# Patient Record
Sex: Female | Born: 1955 | Race: White | Hispanic: No | Marital: Married | Smoking: Never smoker
Health system: Southern US, Community
[De-identification: ages and names within clinical notes are randomized; demographics above are authoritative.]

## PROBLEM LIST (undated history)

## (undated) DIAGNOSIS — R011 Cardiac murmur, unspecified: Secondary | ICD-10-CM

## (undated) DIAGNOSIS — I341 Nonrheumatic mitral (valve) prolapse: Secondary | ICD-10-CM

## (undated) HISTORY — PX: OTHER SURGICAL HISTORY: SHX169

## (undated) HISTORY — PX: ECTOPIC PREGNANCY SURGERY: SHX613

## (undated) HISTORY — PX: TONSILLECTOMY: SUR1361

## (undated) HISTORY — PX: BREAST EXCISIONAL BIOPSY: SUR124

## (undated) HISTORY — PX: BREAST CYST ASPIRATION: SHX578

---

## 1999-08-20 ENCOUNTER — Encounter: Payer: Self-pay | Admitting: Emergency Medicine

## 1999-08-20 ENCOUNTER — Emergency Department (HOSPITAL_COMMUNITY): Admission: EM | Admit: 1999-08-20 | Discharge: 1999-08-21 | Payer: Self-pay | Admitting: Emergency Medicine

## 2006-03-28 ENCOUNTER — Encounter: Admission: RE | Admit: 2006-03-28 | Discharge: 2006-03-28 | Payer: Self-pay | Admitting: Obstetrics and Gynecology

## 2007-10-02 ENCOUNTER — Encounter: Admission: RE | Admit: 2007-10-02 | Discharge: 2007-10-02 | Payer: Self-pay | Admitting: Obstetrics and Gynecology

## 2008-02-15 ENCOUNTER — Ambulatory Visit: Payer: Self-pay | Admitting: Internal Medicine

## 2008-09-26 ENCOUNTER — Encounter: Admission: RE | Admit: 2008-09-26 | Discharge: 2008-09-26 | Payer: Self-pay | Admitting: Obstetrics and Gynecology

## 2009-01-30 ENCOUNTER — Encounter: Payer: Self-pay | Admitting: Internal Medicine

## 2009-02-08 ENCOUNTER — Telehealth: Payer: Self-pay | Admitting: Internal Medicine

## 2009-10-02 ENCOUNTER — Encounter: Admission: RE | Admit: 2009-10-02 | Discharge: 2009-10-02 | Payer: Self-pay | Admitting: Obstetrics and Gynecology

## 2010-10-03 ENCOUNTER — Encounter: Admission: RE | Admit: 2010-10-03 | Discharge: 2010-10-03 | Payer: Self-pay | Admitting: Obstetrics and Gynecology

## 2011-04-23 NOTE — Assessment & Plan Note (Signed)
[facility]                         GASTROENTEROLOGY OFFICE NOTE   Andrea Adkins, Andrea Adkins                     MRN:          [id number]  DATE:02/15/2008                            DOB:          05/07/1956    The patient is a 55 year old white female who is here for evaluation of  heartburn.  She has for the past year had increasing burning  substernally which occurs when she lays down at night, but also  postprandially.  She is having a lot of indigestion.  This past weekend,  she had burning most of the day.  She takes over-the-counter preparation  Rolaids and over-the-counter Pepcid, Zantac, and Prilosec with some  improvement.  She drinks about 2-3 cups of coffee a day.  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  She does not smoke and does not drink  alcohol.  She has had some off and on stress related to her work.   MEDICATIONS:  Calcium supplements.   PAST MEDICAL HISTORY:  Significant for tubal ligation in 1988 and mitral  valve prolapse evaluated by Arturo Morton. Riley Kill, MD, [facility].  She initially  was on SBE prophylaxis, but this has been canceled because of new  guidelines.   FAMILY HISTORY:  Positive for heart disease in father, colon cancer in  mother and sister, and colon polyps in mother.  Diabetes in her father.   SOCIAL HISTORY:  Married with one child.  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  The patient does not smoke and does not drink alcohol.   REVIEW OF SYSTEMS:  Positive for weight gain.  No other symptoms.   PHYSICAL EXAMINATION:  VITAL SIGNS:  Blood pressure 104/62, pulse 86,  and weight 178 pounds.  GENERAL:  She is alert and oriented and in no distress.  Voice was  normal.  There was no cough.  HEENT:  Oral cavity was normal.  NECK:  Supple with no adenopathy.  LUNGS:  Clear to auscultation.  HEART:  Normal S1 and normal S2.  ABDOMEN:  Soft.   There was minimal subxiphoid tenderness.  Costochondral  junctions were normal.  Her lower abdomen and periumbilical area were  normal.  Liver edge at costal margin.  RECTAL:  Not done.  EXTREMITIES:  No edema.   IMPRESSION:  A 55 year old white female with one-year history of  gastroesophageal reflux disease which has not been really treated yet  except for over-the-counter medications intermittently.  The patient's  weight gain may be contributing to her increasing reflux as well as her  caffeine intake and sedentary life.  We have discussed antireflux  measures including weight loss.   PLAN:  1. Begin Protonix 40 mg daily.  This most likely will be accepted by      her [facility], and if not she will let us      know.  2. Antireflux measures and gastroesophageal reflux disease.  3. Upper endoscopy.  The patient would like to wait depending on  whether she is improved symptomatically on her medications.  4. The patient will need colonoscopy when she turns 52 because of her      family history.  If she has not had an endoscopy at that point we      probably could do both procedures at the same time.     [name]. Juanda Chance, MD  Electronically Signed    DMB/MedQ  DD: 02/15/2008  DT: 02/15/2008  Job #: [id number]   cc:   Marcelino Duster L. Vincente Poli, M.D.

## 2011-08-22 ENCOUNTER — Other Ambulatory Visit: Payer: Self-pay | Admitting: Obstetrics and Gynecology

## 2011-08-22 DIAGNOSIS — Z1231 Encounter for screening mammogram for malignant neoplasm of breast: Secondary | ICD-10-CM

## 2011-10-07 ENCOUNTER — Ambulatory Visit
Admission: RE | Admit: 2011-10-07 | Discharge: 2011-10-07 | Disposition: A | Discharge status: Home / Self Care | Payer: BC Managed Care – PPO | Source: Ambulatory Visit | Attending: Obstetrics and Gynecology | Admitting: Obstetrics and Gynecology

## 2011-10-07 DIAGNOSIS — Z1231 Encounter for screening mammogram for malignant neoplasm of breast: Secondary | ICD-10-CM

## 2012-04-27 ENCOUNTER — Emergency Department (HOSPITAL_COMMUNITY)
Admission: EM | Admit: 2012-04-27 | Discharge: 2012-04-27 | Disposition: A | Discharge status: Home / Self Care | Payer: Worker's Compensation | Attending: Emergency Medicine | Admitting: Emergency Medicine

## 2012-04-27 ENCOUNTER — Encounter (HOSPITAL_COMMUNITY): Payer: Self-pay | Admitting: *Deleted

## 2012-04-27 ENCOUNTER — Emergency Department (HOSPITAL_COMMUNITY): Payer: Worker's Compensation

## 2012-04-27 DIAGNOSIS — Y9229 Other specified public building as the place of occurrence of the external cause: Secondary | ICD-10-CM | POA: Insufficient documentation

## 2012-04-27 DIAGNOSIS — S82402A Unspecified fracture of shaft of left fibula, initial encounter for closed fracture: Secondary | ICD-10-CM

## 2012-04-27 DIAGNOSIS — S8263XA Displaced fracture of lateral malleolus of unspecified fibula, initial encounter for closed fracture: Secondary | ICD-10-CM | POA: Insufficient documentation

## 2012-04-27 DIAGNOSIS — I059 Rheumatic mitral valve disease, unspecified: Secondary | ICD-10-CM | POA: Insufficient documentation

## 2012-04-27 DIAGNOSIS — W1789XA Other fall from one level to another, initial encounter: Secondary | ICD-10-CM | POA: Insufficient documentation

## 2012-04-27 HISTORY — DX: Nonrheumatic mitral (valve) prolapse: I34.1

## 2012-04-27 NOTE — ED Notes (Signed)
Stepped in hole, twisted left ankle, c/o pain.

## 2012-04-27 NOTE — ED Provider Notes (Signed)
History     CSN: [id number]  Arrival date & time 04/27/12  1007   First MD Initiated Contact with Patient 04/27/12 1037      Chief Complaint  Patient presents with  . Ankle Pain    (Consider location/radiation/quality/duration/timing/severity/associated sxs/prior treatment) HPI Comments: Descending steps.  Stepped from last step into grass and inverted L ankle in a hole.  No other injuries.  Patient is a 56 y.o. female presenting with ankle pain. The history is provided by the patient. No language interpreter was used.  Ankle Pain  The incident occurred 1 to 2 hours ago. The incident occurred at work. The quality of the pain is described as aching. The pain is mild. Associated symptoms include inability to bear weight. Pertinent negatives include no numbness, no loss of motion, no muscle weakness, no loss of sensation and no tingling. She has tried nothing for the symptoms.    Past Medical History  Diagnosis Date  . Mitral valve prolapse     Past Surgical History  Procedure Date  . Arm surgery   . Ectopic pregnancy surgery   . Tonsillectomy     No family history on file.  History  Substance Use Topics  . Smoking status: Never Smoker   . Smokeless tobacco: Not on file  . Alcohol Use: No    OB History    Grav Para Term Preterm Abortions TAB SAB Ect Mult Living                  Review of Systems  Musculoskeletal:       Ankle injury  Neurological: Negative for tingling, weakness and numbness.  All other systems reviewed and are negative.    Allergies  Codeine  Home Medications   Current Outpatient Rx  Name Route Sig Dispense Refill  . ASPIRIN-ACETAMINOPHEN-CAFFEINE 500-325-65 MG PO PACK Oral Take 1 Package by mouth as needed. For pain    . CALCIUM PO Oral Take 1 tablet by mouth daily.    Marland Kitchen VITAMIN D PO Oral Take 1 tablet by mouth daily.    . OMEGA-3 FATTY ACIDS 1000 MG PO CAPS Oral Take 2 g by mouth daily.    . IBUPROFEN 200 MG PO TABS Oral Take 800 mg  by mouth every 6 (six) hours as needed. For pain    . OVER THE COUNTER MEDICATION Oral Take 1 tablet by mouth daily. Allertec      BP 122/77  Pulse 70  Resp 16  Ht 5\' 7"  (1.702 m)  Wt 160 lb (72.576 kg)  BMI 25.06 kg/m2  SpO2 100%  Physical Exam  Nursing note and vitals reviewed. Constitutional: She is oriented to person, place, and time. She appears well-developed and well-nourished. No distress.  HENT:  Head: Normocephalic and atraumatic.  Eyes: EOM are normal.  Neck: Normal range of motion.  Cardiovascular: Normal rate, regular rhythm and normal heart sounds.   Pulmonary/Chest: Effort normal and breath sounds normal.  Abdominal: Soft. She exhibits no distension. There is no tenderness.  Musculoskeletal: She exhibits tenderness.       Left ankle: She exhibits decreased range of motion and swelling. She exhibits no ecchymosis, no deformity, no laceration and normal pulse. tenderness. Lateral malleolus tenderness found.       + swelling over lat malleolus.  Skin intact.  Neurological: She is alert and oriented to person, place, and time.  Skin: Skin is warm and dry.  Psychiatric: She has a normal mood and affect. Judgment normal.  ED Course  Procedures (including critical care time)  Labs Reviewed - No data to display Dg Ankle Complete Left  04/27/2012  *RADIOLOGY REPORT*  Clinical Data: Larey Seat in a hole, left ankle injury  LEFT ANKLE COMPLETE - 3+ VIEW  Comparison: None.  Findings: Osseous demineralization. Ankle mortise intact. Lateral soft tissue small plantar calcaneal spur. Minimal obliquity on lateral view. Nondisplaced lateral malleolar fracture. No additional fracture or dislocation identified.  IMPRESSION: Nondisplaced lateral malleolar fracture. Osseous demineralization. Plantar calcaneal spur.  Original Report Authenticated By: Lollie Marrow, M.D.     1. Closed left fibular fracture, initial encounter       MDM  Post splint.  Crutches , ice and elevation.  F/u  with [facility] or orthopedist of choice.        Worthy Rancher, PA 04/27/12 1149

## 2012-04-27 NOTE — Discharge Instructions (Signed)
cCrutch Use You have been prescribed crutches to take weight off one of your lower legs or feet (extremities). When using crutches, make sure you are not putting pressure on the armpit (axilla). This could cause damage to the nerves that extend from your axilla to the hand and arm. When fitted properly the crutches should be 2 to 3 finger widths below the axilla. Your weight should be supported by your hand, and not by resting upon the crutch with the axilla. When walking, first step with the crutches, then swing the healthy leg through and slightly ahead. When going up stairs, first step up with the healthy leg and then follow with the crutches and injured leg up to the same step, and so forth. If there is a handrail, hold both crutches in one hand, place your other hand on the handrail, and while placing your weight on your arms, lift your good leg to the step, then bring the crutches and the injured leg up to that step. Repeat for each step. When going down stairs, first step with the injured leg and crutches, following down with the healthy leg to the same step. Be very careful, as going down stairs with crutches is very challenging. If you feel wobbly or nervous, sit down and inch yourself down the stairs on your butt. To get up from a chair, hold injured leg forward, grab armrest with one hand and the top of the crutches with the other hand. Using these supports, pull yourself up to a standing position. Reverse this procedure for sitting. See your caregiver for follow up as suggested. If you are discharged in an ace wrap and develop numbness, tingling, swelling, or increased pain, loosen the ace wrap and re-wrap looser. If these problems persist, see your caregiver as needed. If you have been instructed to use partial weight bearing, bear (apply) the amount of weight as suggested by your caregiver. Do not bear weight in an amount that causes pain on the area of injury. Document Released: 11/22/2000  Document Revised: 11/14/2011 Document Reviewed: 01/30/2009 [facility] Patient Information 2012 [place], [place].Cryotherapy Cryotherapy means treatment with cold. Ice or gel packs can be used to reduce both pain and swelling. Ice is the most helpful within the first 24 to 48 hours after an injury or flareup from overusing a muscle or joint. Sprains, strains, spasms, burning pain, shooting pain, and aches can all be eased with ice. Ice can also be used when recovering from surgery. Ice is effective, has very few side effects, and is safe for most people to use. PRECAUTIONS  Ice is not a safe treatment option for people with:  Raynaud's phenomenon. This is a condition affecting small blood vessels in the extremities. Exposure to cold may cause your problems to return.   Cold hypersensitivity. There are many forms of cold hypersensitivity, including:   Cold urticaria. Red, itchy hives appear on the skin when the tissues begin to warm after being iced.   Cold erythema. This is a red, itchy rash caused by exposure to cold.   Cold hemoglobinuria. Red blood cells break down when the tissues begin to warm after being iced. The hemoglobin that carry oxygen are passed into the urine because they cannot combine with blood proteins fast enough.   Numbness or altered sensitivity in the area being iced.  If you have any of the following conditions, do not use ice until you have discussed cryotherapy with your caregiver:  Heart conditions, such as arrhythmia, angina, or chronic  heart disease.   High blood pressure.   Healing wounds or open skin in the area being iced.   Current infections.   Rheumatoid arthritis.   Poor circulation.   Diabetes.  Ice slows the blood flow in the region it is applied. This is beneficial when trying to stop inflamed tissues from spreading irritating chemicals to surrounding tissues. However, if you expose your skin to cold temperatures for too long or without the proper  protection, you can damage your skin or nerves. Watch for signs of skin damage due to cold. HOME CARE INSTRUCTIONS Follow these tips to use ice and cold packs safely.  Place a dry or damp towel between the ice and skin. A damp towel will cool the skin more quickly, so you may need to shorten the time that the ice is used.   For a more rapid response, add gentle compression to the ice.   Ice for no more than 10 to 20 minutes at a time. The bonier the area you are icing, the less time it will take to get the benefits of ice.   Check your skin after 5 minutes to make sure there are no signs of a poor response to cold or skin damage.   Rest 20 minutes or more in between uses.   Once your skin is numb, you can end your treatment. You can test numbness by very lightly touching your skin. The touch should be so light that you do not see the skin dimple from the pressure of your fingertip. When using ice, most people will feel these normal sensations in this order: cold, burning, aching, and numbness.   Do not use ice on someone who cannot communicate their responses to pain, such as small children or people with dementia.  HOW TO MAKE AN ICE PACK Ice packs are the most common way to use ice therapy. Other methods include ice massage, ice baths, and cryo-sprays. Muscle creams that cause a cold, tingly feeling do not offer the same benefits that ice offers and should not be used as a substitute unless recommended by your caregiver. To make an ice pack, do one of the following:  Place crushed ice or a bag of frozen vegetables in a sealable plastic bag. Squeeze out the excess air. Place this bag inside another plastic bag. Slide the bag into a pillowcase or place a damp towel between your skin and the bag.   Mix 3 parts water with 1 part rubbing alcohol. Freeze the mixture in a sealable plastic bag. When you remove the mixture from the freezer, it will be slushy. Squeeze out the excess air. Place this  bag inside another plastic bag. Slide the bag into a pillowcase or place a damp towel between your skin and the bag.  SEEK MEDICAL CARE IF:  You develop white spots on your skin. This may give the skin a blotchy (mottled) appearance.   Your skin turns blue or pale.   Your skin becomes waxy or hard.   Your swelling gets worse.  MAKE SURE YOU:   Understand these instructions.   Will watch your condition.   Will get help right away if you are not doing well or get worse.  Document Released: 07/22/2011 Document Revised: 11/14/2011 Document Reviewed: 07/22/2011 [facility] Patient Information 2012 [place], [place].   Use the crutches as needed and bear weight as tolerated.  Apply ice several times daily and elevate as much as possible.  Follow up with dr. Hilda Lias or  the orthopedist of your choice.

## 2012-04-29 NOTE — ED Provider Notes (Signed)
Medical screening examination/treatment/procedure(s) were performed by non-physician practitioner and as supervising physician I was immediately available for consultation/collaboration.  Raed Schalk, MD 04/29/12 0909 

## 2012-09-11 ENCOUNTER — Other Ambulatory Visit: Payer: Self-pay | Admitting: Obstetrics and Gynecology

## 2012-09-11 DIAGNOSIS — Z1231 Encounter for screening mammogram for malignant neoplasm of breast: Secondary | ICD-10-CM

## 2012-10-07 ENCOUNTER — Ambulatory Visit
Admission: RE | Admit: 2012-10-07 | Discharge: 2012-10-07 | Disposition: A | Discharge status: Home / Self Care | Payer: BC Managed Care – PPO | Source: Ambulatory Visit | Attending: Obstetrics and Gynecology | Admitting: Obstetrics and Gynecology

## 2012-10-07 DIAGNOSIS — Z1231 Encounter for screening mammogram for malignant neoplasm of breast: Secondary | ICD-10-CM

## 2014-09-20 ENCOUNTER — Other Ambulatory Visit: Payer: Self-pay

## 2014-09-20 DIAGNOSIS — Z1239 Encounter for other screening for malignant neoplasm of breast: Secondary | ICD-10-CM

## 2014-09-29 ENCOUNTER — Other Ambulatory Visit: Payer: Self-pay

## 2014-09-29 DIAGNOSIS — Z1231 Encounter for screening mammogram for malignant neoplasm of breast: Secondary | ICD-10-CM

## 2014-10-05 ENCOUNTER — Ambulatory Visit
Admission: RE | Admit: 2014-10-05 | Discharge: 2014-10-05 | Disposition: A | Discharge status: Home / Self Care | Payer: Self-pay | Source: Ambulatory Visit

## 2014-10-05 DIAGNOSIS — Z1231 Encounter for screening mammogram for malignant neoplasm of breast: Secondary | ICD-10-CM

## 2015-09-05 ENCOUNTER — Other Ambulatory Visit: Payer: Self-pay

## 2015-09-05 DIAGNOSIS — Z1231 Encounter for screening mammogram for malignant neoplasm of breast: Secondary | ICD-10-CM

## 2015-10-09 ENCOUNTER — Ambulatory Visit
Admission: RE | Admit: 2015-10-09 | Discharge: 2015-10-09 | Disposition: A | Discharge status: Home / Self Care | Payer: BC Managed Care – PPO | Source: Ambulatory Visit

## 2015-10-09 DIAGNOSIS — Z1231 Encounter for screening mammogram for malignant neoplasm of breast: Secondary | ICD-10-CM

## 2016-09-09 ENCOUNTER — Other Ambulatory Visit: Payer: Self-pay | Admitting: Obstetrics and Gynecology

## 2016-09-09 DIAGNOSIS — N6322 Unspecified lump in the left breast, upper inner quadrant: Secondary | ICD-10-CM

## 2016-09-11 ENCOUNTER — Ambulatory Visit
Admission: RE | Admit: 2016-09-11 | Discharge: 2016-09-11 | Disposition: A | Discharge status: Home / Self Care | Payer: BC Managed Care – PPO | Source: Ambulatory Visit | Attending: Obstetrics and Gynecology | Admitting: Obstetrics and Gynecology

## 2016-09-11 ENCOUNTER — Other Ambulatory Visit: Payer: Self-pay | Admitting: Obstetrics and Gynecology

## 2016-09-11 DIAGNOSIS — N6322 Unspecified lump in the left breast, upper inner quadrant: Secondary | ICD-10-CM

## 2016-09-23 ENCOUNTER — Other Ambulatory Visit: Payer: Self-pay | Admitting: Obstetrics and Gynecology

## 2016-10-11 ENCOUNTER — Other Ambulatory Visit: Payer: Self-pay | Admitting: Obstetrics and Gynecology

## 2016-10-11 DIAGNOSIS — Z1231 Encounter for screening mammogram for malignant neoplasm of breast: Secondary | ICD-10-CM

## 2016-11-14 ENCOUNTER — Ambulatory Visit
Admission: RE | Admit: 2016-11-14 | Discharge: 2016-11-14 | Disposition: A | Discharge status: Home / Self Care | Payer: BC Managed Care – PPO | Source: Ambulatory Visit | Attending: Obstetrics and Gynecology | Admitting: Obstetrics and Gynecology

## 2016-11-14 DIAGNOSIS — Z1231 Encounter for screening mammogram for malignant neoplasm of breast: Secondary | ICD-10-CM

## 2017-06-20 ENCOUNTER — Other Ambulatory Visit: Payer: Self-pay | Admitting: Obstetrics and Gynecology

## 2017-06-20 DIAGNOSIS — N63 Unspecified lump in unspecified breast: Secondary | ICD-10-CM

## 2017-06-26 ENCOUNTER — Other Ambulatory Visit: Payer: Self-pay

## 2017-06-30 ENCOUNTER — Other Ambulatory Visit: Payer: Self-pay | Admitting: Obstetrics and Gynecology

## 2017-06-30 ENCOUNTER — Ambulatory Visit
Admission: RE | Admit: 2017-06-30 | Discharge: 2017-06-30 | Disposition: A | Discharge status: Home / Self Care | Payer: BC Managed Care – PPO | Source: Ambulatory Visit | Attending: Obstetrics and Gynecology | Admitting: Obstetrics and Gynecology

## 2017-06-30 DIAGNOSIS — N6310 Unspecified lump in the right breast, unspecified quadrant: Secondary | ICD-10-CM

## 2017-06-30 DIAGNOSIS — N63 Unspecified lump in unspecified breast: Secondary | ICD-10-CM

## 2017-06-30 DIAGNOSIS — N6320 Unspecified lump in the left breast, unspecified quadrant: Secondary | ICD-10-CM

## 2017-06-30 DIAGNOSIS — N6489 Other specified disorders of breast: Secondary | ICD-10-CM

## 2017-06-30 DIAGNOSIS — N6001 Solitary cyst of right breast: Secondary | ICD-10-CM

## 2017-06-30 DIAGNOSIS — N6002 Solitary cyst of left breast: Secondary | ICD-10-CM

## 2017-07-04 ENCOUNTER — Ambulatory Visit
Admission: RE | Admit: 2017-07-04 | Discharge: 2017-07-04 | Disposition: A | Discharge status: Home / Self Care | Payer: BC Managed Care – PPO | Source: Ambulatory Visit | Attending: Obstetrics and Gynecology | Admitting: Obstetrics and Gynecology

## 2017-07-04 ENCOUNTER — Other Ambulatory Visit: Payer: Self-pay | Admitting: Obstetrics and Gynecology

## 2017-07-04 ENCOUNTER — Other Ambulatory Visit: Payer: Self-pay

## 2017-07-04 DIAGNOSIS — N6489 Other specified disorders of breast: Secondary | ICD-10-CM

## 2017-07-04 DIAGNOSIS — N6002 Solitary cyst of left breast: Secondary | ICD-10-CM

## 2017-07-04 DIAGNOSIS — N6001 Solitary cyst of right breast: Secondary | ICD-10-CM

## 2017-07-07 ENCOUNTER — Other Ambulatory Visit: Payer: Self-pay | Admitting: Obstetrics and Gynecology

## 2017-07-07 DIAGNOSIS — N6489 Other specified disorders of breast: Secondary | ICD-10-CM

## 2017-07-10 ENCOUNTER — Ambulatory Visit
Admission: RE | Admit: 2017-07-10 | Discharge: 2017-07-10 | Disposition: A | Discharge status: Home / Self Care | Payer: BC Managed Care – PPO | Source: Ambulatory Visit | Attending: Obstetrics and Gynecology | Admitting: Obstetrics and Gynecology

## 2017-07-10 ENCOUNTER — Other Ambulatory Visit: Payer: Self-pay | Admitting: Obstetrics and Gynecology

## 2017-07-10 DIAGNOSIS — N6489 Other specified disorders of breast: Secondary | ICD-10-CM

## 2017-08-14 ENCOUNTER — Other Ambulatory Visit: Payer: Self-pay | Admitting: General Surgery

## 2017-08-14 DIAGNOSIS — N6489 Other specified disorders of breast: Secondary | ICD-10-CM

## 2017-08-25 ENCOUNTER — Encounter (HOSPITAL_BASED_OUTPATIENT_CLINIC_OR_DEPARTMENT_OTHER): Payer: Self-pay | Admitting: *Deleted

## 2017-08-29 ENCOUNTER — Ambulatory Visit
Admission: RE | Admit: 2017-08-29 | Discharge: 2017-08-29 | Disposition: A | Discharge status: Home / Self Care | Payer: BC Managed Care – PPO | Source: Ambulatory Visit | Attending: General Surgery | Admitting: General Surgery

## 2017-08-29 DIAGNOSIS — N6489 Other specified disorders of breast: Secondary | ICD-10-CM

## 2017-08-29 NOTE — Progress Notes (Signed)
Ensure pre surgery drink given with instructions to complete by 0815 dos, pt verbalized understanding. 

## 2017-09-01 ENCOUNTER — Encounter (HOSPITAL_BASED_OUTPATIENT_CLINIC_OR_DEPARTMENT_OTHER)
Admission: RE | Disposition: A | Discharge status: Home / Self Care | Payer: Self-pay | Source: Ambulatory Visit | Attending: General Surgery

## 2017-09-01 ENCOUNTER — Ambulatory Visit (HOSPITAL_BASED_OUTPATIENT_CLINIC_OR_DEPARTMENT_OTHER): Payer: BC Managed Care – PPO | Admitting: Anesthesiology

## 2017-09-01 ENCOUNTER — Encounter (HOSPITAL_BASED_OUTPATIENT_CLINIC_OR_DEPARTMENT_OTHER): Payer: Self-pay | Admitting: *Deleted

## 2017-09-01 ENCOUNTER — Ambulatory Visit (HOSPITAL_BASED_OUTPATIENT_CLINIC_OR_DEPARTMENT_OTHER)
Admission: RE | Admit: 2017-09-01 | Discharge: 2017-09-01 | Disposition: A | Discharge status: Home / Self Care | Payer: BC Managed Care – PPO | Source: Ambulatory Visit | Attending: General Surgery | Admitting: General Surgery

## 2017-09-01 ENCOUNTER — Ambulatory Visit
Admission: RE | Admit: 2017-09-01 | Discharge: 2017-09-01 | Disposition: A | Discharge status: Home / Self Care | Payer: BC Managed Care – PPO | Source: Ambulatory Visit | Attending: General Surgery | Admitting: General Surgery

## 2017-09-01 DIAGNOSIS — N6489 Other specified disorders of breast: Secondary | ICD-10-CM | POA: Diagnosis not present

## 2017-09-01 DIAGNOSIS — N62 Hypertrophy of breast: Secondary | ICD-10-CM | POA: Insufficient documentation

## 2017-09-01 HISTORY — DX: Cardiac murmur, unspecified: R01.1

## 2017-09-01 HISTORY — PX: RADIOACTIVE SEED GUIDED EXCISIONAL BREAST BIOPSY: SHX6490

## 2017-09-01 SURGERY — RADIOACTIVE SEED GUIDED BREAST BIOPSY
Anesthesia: General | Site: Breast | Laterality: Right

## 2017-09-01 MED ORDER — GABAPENTIN 300 MG PO CAPS
ORAL_CAPSULE | ORAL | Status: AC
  Filled 2017-09-01: qty 1

## 2017-09-01 MED ORDER — PROPOFOL 10 MG/ML IV BOLUS
INTRAVENOUS | Status: DC | PRN
  Administered 2017-09-01: 130 mg via INTRAVENOUS

## 2017-09-01 MED ORDER — CELECOXIB 200 MG PO CAPS
200.0000 mg | ORAL_CAPSULE | ORAL | Status: AC
  Administered 2017-09-01: 200 mg via ORAL

## 2017-09-01 MED ORDER — FENTANYL CITRATE (PF) 100 MCG/2ML IJ SOLN
INTRAMUSCULAR | Status: AC
  Filled 2017-09-01: qty 2

## 2017-09-01 MED ORDER — ACETAMINOPHEN 500 MG PO TABS
1000.0000 mg | ORAL_TABLET | ORAL | Status: AC
  Administered 2017-09-01: 1000 mg via ORAL

## 2017-09-01 MED ORDER — BUPIVACAINE HCL (PF) 0.25 % IJ SOLN
INTRAMUSCULAR | Status: DC | PRN
  Administered 2017-09-01: 10 mL

## 2017-09-01 MED ORDER — PROPOFOL 10 MG/ML IV BOLUS
INTRAVENOUS | Status: AC
  Filled 2017-09-01: qty 20

## 2017-09-01 MED ORDER — MIDAZOLAM HCL 2 MG/2ML IJ SOLN
INTRAMUSCULAR | Status: AC
  Filled 2017-09-01: qty 2

## 2017-09-01 MED ORDER — SCOPOLAMINE 1 MG/3DAYS TD PT72: 1.0000 | MEDICATED_PATCH | Freq: Once | TRANSDERMAL | Status: DC | PRN

## 2017-09-01 MED ORDER — CEFAZOLIN SODIUM-DEXTROSE 2-4 GM/100ML-% IV SOLN
2.0000 g | INTRAVENOUS | Status: AC
  Administered 2017-09-01: 2 g via INTRAVENOUS

## 2017-09-01 MED ORDER — LIDOCAINE 2% (20 MG/ML) 5 ML SYRINGE
INTRAMUSCULAR | Status: DC | PRN
  Administered 2017-09-01: 60 mg via INTRAVENOUS

## 2017-09-01 MED ORDER — ACETAMINOPHEN 500 MG PO TABS
ORAL_TABLET | ORAL | Status: AC
  Filled 2017-09-01: qty 2

## 2017-09-01 MED ORDER — LACTATED RINGERS IV SOLN
INTRAVENOUS | Status: DC
  Administered 2017-09-01 (×2): via INTRAVENOUS

## 2017-09-01 MED ORDER — LIDOCAINE 2% (20 MG/ML) 5 ML SYRINGE
INTRAMUSCULAR | Status: AC
  Filled 2017-09-01: qty 5

## 2017-09-01 MED ORDER — KETOROLAC TROMETHAMINE 30 MG/ML IJ SOLN: 30.0000 mg | Freq: Once | INTRAMUSCULAR | Status: DC | PRN

## 2017-09-01 MED ORDER — ONDANSETRON HCL 4 MG/2ML IJ SOLN
INTRAMUSCULAR | Status: AC
  Filled 2017-09-01: qty 2

## 2017-09-01 MED ORDER — MIDAZOLAM HCL 2 MG/2ML IJ SOLN
1.0000 mg | INTRAMUSCULAR | Status: DC | PRN
  Administered 2017-09-01: 2 mg via INTRAVENOUS

## 2017-09-01 MED ORDER — CELECOXIB 200 MG PO CAPS
ORAL_CAPSULE | ORAL | Status: AC
  Filled 2017-09-01: qty 1

## 2017-09-01 MED ORDER — CEFAZOLIN SODIUM-DEXTROSE 2-4 GM/100ML-% IV SOLN
INTRAVENOUS | Status: AC
  Filled 2017-09-01: qty 100

## 2017-09-01 MED ORDER — FENTANYL CITRATE (PF) 100 MCG/2ML IJ SOLN
50.0000 ug | INTRAMUSCULAR | Status: DC | PRN
  Administered 2017-09-01: 100 ug via INTRAVENOUS
  Administered 2017-09-01: 25 ug via INTRAVENOUS

## 2017-09-01 MED ORDER — DEXAMETHASONE SODIUM PHOSPHATE 10 MG/ML IJ SOLN
INTRAMUSCULAR | Status: AC
  Filled 2017-09-01: qty 1

## 2017-09-01 MED ORDER — GABAPENTIN 300 MG PO CAPS
300.0000 mg | ORAL_CAPSULE | ORAL | Status: AC
  Administered 2017-09-01: 300 mg via ORAL

## 2017-09-01 MED ORDER — PROMETHAZINE HCL 25 MG/ML IJ SOLN: 6.2500 mg | INTRAMUSCULAR | Status: DC | PRN

## 2017-09-01 MED ORDER — FENTANYL CITRATE (PF) 100 MCG/2ML IJ SOLN: 25.0000 ug | INTRAMUSCULAR | Status: DC | PRN

## 2017-09-01 SURGICAL SUPPLY — 58 items
ADH SKN CLS APL DERMABOND .7 (GAUZE/BANDAGES/DRESSINGS) ×1
APPLIER CLIP 9.375 MED OPEN (MISCELLANEOUS)
APR CLP MED 9.3 20 MLT OPN (MISCELLANEOUS)
BINDER BREAST LRG (GAUZE/BANDAGES/DRESSINGS) IMPLANT
BINDER BREAST MEDIUM (GAUZE/BANDAGES/DRESSINGS) ×2 IMPLANT
BINDER BREAST XLRG (GAUZE/BANDAGES/DRESSINGS) IMPLANT
BINDER BREAST XXLRG (GAUZE/BANDAGES/DRESSINGS) IMPLANT
BLADE SURG 15 STRL LF DISP TIS (BLADE) ×1 IMPLANT
BLADE SURG 15 STRL SS (BLADE) ×2
CANISTER SUC SOCK COL 7IN (MISCELLANEOUS) IMPLANT
CANISTER SUCT 1200ML W/VALVE (MISCELLANEOUS) ×2 IMPLANT
CHLORAPREP W/TINT 26ML (MISCELLANEOUS) ×2 IMPLANT
CLIP APPLIE 9.375 MED OPEN (MISCELLANEOUS) IMPLANT
CLIP VESOCCLUDE SM WIDE 6/CT (CLIP) ×2 IMPLANT
COVER BACK TABLE 60X90IN (DRAPES) ×2 IMPLANT
COVER MAYO STAND STRL (DRAPES) ×2 IMPLANT
COVER PROBE W GEL 5X96 (DRAPES) IMPLANT
DECANTER SPIKE VIAL GLASS SM (MISCELLANEOUS) IMPLANT
DERMABOND ADVANCED (GAUZE/BANDAGES/DRESSINGS) ×1
DERMABOND ADVANCED .7 DNX12 (GAUZE/BANDAGES/DRESSINGS) ×1 IMPLANT
DEVICE DUBIN W/COMP PLATE 8390 (MISCELLANEOUS) ×2 IMPLANT
DRAPE LAPAROSCOPIC ABDOMINAL (DRAPES) ×2 IMPLANT
DRAPE UTILITY XL STRL (DRAPES) ×2 IMPLANT
DRSG TEGADERM 4X4.75 (GAUZE/BANDAGES/DRESSINGS) IMPLANT
ELECT COATED BLADE 2.86 ST (ELECTRODE) ×2 IMPLANT
ELECT REM PT RETURN 9FT ADLT (ELECTROSURGICAL) ×2
ELECTRODE REM PT RTRN 9FT ADLT (ELECTROSURGICAL) ×1 IMPLANT
GAUZE SPONGE 4X4 12PLY STRL LF (GAUZE/BANDAGES/DRESSINGS) IMPLANT
GLOVE BIO SURGEON STRL SZ7 (GLOVE) ×4 IMPLANT
GLOVE BIOGEL PI IND STRL 7.5 (GLOVE) ×1 IMPLANT
GLOVE BIOGEL PI INDICATOR 7.5 (GLOVE) ×1
GLOVE ECLIPSE 6.5 STRL STRAW (GLOVE) ×2 IMPLANT
GLOVE SURG SS PI 6.5 STRL IVOR (GLOVE) ×2 IMPLANT
GOWN STRL REUS W/ TWL LRG LVL3 (GOWN DISPOSABLE) ×3 IMPLANT
GOWN STRL REUS W/TWL LRG LVL3 (GOWN DISPOSABLE) ×6
HEMOSTAT ARISTA ABSORB 3G PWDR (MISCELLANEOUS) ×2 IMPLANT
ILLUMINATOR WAVEGUIDE N/F (MISCELLANEOUS) IMPLANT
KIT MARKER MARGIN INK (KITS) ×2 IMPLANT
LIGHT WAVEGUIDE WIDE FLAT (MISCELLANEOUS) IMPLANT
NEEDLE HYPO 25X1 1.5 SAFETY (NEEDLE) ×2 IMPLANT
NS IRRIG 1000ML POUR BTL (IV SOLUTION) IMPLANT
PACK BASIN DAY SURGERY FS (CUSTOM PROCEDURE TRAY) ×2 IMPLANT
PENCIL BUTTON HOLSTER BLD 10FT (ELECTRODE) ×2 IMPLANT
SLEEVE SCD COMPRESS KNEE MED (MISCELLANEOUS) ×2 IMPLANT
SPONGE LAP 4X18 X RAY DECT (DISPOSABLE) ×2 IMPLANT
STRIP CLOSURE SKIN 1/2X4 (GAUZE/BANDAGES/DRESSINGS) ×2 IMPLANT
SUT MNCRL AB 4-0 PS2 18 (SUTURE) IMPLANT
SUT MON AB 5-0 PS2 18 (SUTURE) ×2 IMPLANT
SUT SILK 2 0 SH (SUTURE) IMPLANT
SUT VIC AB 2-0 SH 27 (SUTURE) ×2
SUT VIC AB 2-0 SH 27XBRD (SUTURE) ×1 IMPLANT
SUT VIC AB 3-0 SH 27 (SUTURE) ×2
SUT VIC AB 3-0 SH 27X BRD (SUTURE) ×1 IMPLANT
SYR CONTROL 10ML LL (SYRINGE) ×2 IMPLANT
TOWEL OR 17X24 6PK STRL BLUE (TOWEL DISPOSABLE) ×2 IMPLANT
TOWEL OR NON WOVEN STRL DISP B (DISPOSABLE) ×2 IMPLANT
TUBE CONNECTING 20X1/4 (TUBING) IMPLANT
YANKAUER SUCT BULB TIP NO VENT (SUCTIONS) IMPLANT

## 2017-09-01 NOTE — H&P (Signed)
61 yof referred by Dr Lyman Bishop for right breast distortion. she had bilateral breast masses that end up being cysts. she has no nipple dc.cysts have been aspirated and are gone at this point. she has c density breasts. 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 she is here to discuss options.   Past Surgical History (Tanisha A. Manson Passey, RMA; 08/14/2017 10:12 AM) Breast Biopsy  Right. Tonsillectomy   Diagnostic Studies History (Tanisha A. Manson Passey, RMA; 08/14/2017 10:12 AM) Colonoscopy  5-10 years ago Mammogram  1-3 years ago  Allergies (Tanisha A. Manson Passey, RMA; 08/14/2017 10:13 AM) Codeine Phosphate *ANALGESICS - OPIOID*  Allergies Reconciled   Medication History (Tanisha A. Manson Passey, RMA; 08/14/2017 10:13 AM) No Current Medications Medications Reconciled  Social History (Tanisha A. Manson Passey, RMA; 08/14/2017 10:12 AM) Caffeine use  Coffee. No alcohol use  No drug use  Tobacco use  Never smoker.  Family History (Tanisha A. Manson Passey, RMA; 08/14/2017 10:12 AM) Arthritis  Mother. Diabetes Mellitus  Father. Heart Disease  Father, Mother. Heart disease in female family member before age 60  Prostate Cancer  Father.  Pregnancy / Birth History (Tanisha A. Manson Passey, RMA; 08/14/2017 10:12 AM) Age at menarche  16 years. Age of menopause  51-57 Contraceptive History  Oral contraceptives. Gravida  2 Maternal age  23-25 Para  1  Other Problems (Tanisha A. Manson Passey, RMA; 08/14/2017 10:12 AM) Heart murmur  Other disease, cancer, significant illness     Review of Systems (Tanisha A. Brown RMA; 08/14/2017 10:12 AM) General Not Present- Appetite Loss, Chills, Fatigue, Fever, Night Sweats, Weight Gain and Weight Loss. Skin Not Present- Change in Wart/Mole, Dryness, Hives, Jaundice, New Lesions, Non-Healing Wounds, Rash and Ulcer. HEENT Not Present- Earache, Hearing Loss, Hoarseness, Nose Bleed, Oral Ulcers, Ringing in the Ears, Seasonal  Allergies, Sinus Pain, Sore Throat, Visual Disturbances, Wears glasses/contact lenses and Yellow Eyes. Breast Present- Breast Mass. Not Present- Breast Pain, Nipple Discharge and Skin Changes. Cardiovascular Not Present- Chest Pain, Difficulty Breathing Lying Down, Leg Cramps, Palpitations, Rapid Heart Rate, Shortness of Breath and Swelling of Extremities. Gastrointestinal Not Present- Abdominal Pain, Bloating, Bloody Stool, Change in Bowel Habits, Chronic diarrhea, Constipation, Difficulty Swallowing, Excessive gas, Gets full quickly at meals, Hemorrhoids, Indigestion, Nausea, Rectal Pain and Vomiting. Female Genitourinary Not Present- Frequency, Nocturia, Painful Urination, Pelvic Pain and Urgency. Musculoskeletal Present- Back Pain. Not Present- Joint Pain, Joint Stiffness, Muscle Pain, Muscle Weakness and Swelling of Extremities. Neurological Not Present- Decreased Memory, Fainting, Headaches, Numbness, Seizures, Tingling, Tremor, Trouble walking and Weakness. Psychiatric Not Present- Anxiety, Bipolar, Change in Sleep Pattern, Depression, Fearful and Frequent crying. Endocrine Not Present- Cold Intolerance, Excessive Hunger, Hair Changes, Heat Intolerance, Hot flashes and New Diabetes. Hematology Not Present- Blood Thinners, Easy Bruising, Excessive bleeding, Gland problems, HIV and Persistent Infections.  Vitals (Tanisha A. Brown RMA; 08/14/2017 10:13 AM) 08/14/2017 10:12 AM Weight: 177.4 lb Height: 67in Body Surface Area: 1.92 m Body Mass Index: 27.78 kg/m  Temp.: 97.101F  Pulse: 85 (Regular)  P.OX: 98% (Room air) BP: 124/76 (Sitting, Left Arm, Standard) Physical Exam Emelia Loron MD; 08/14/2017 11:27 AM) General Mental Status-Alert. Orientation-Oriented X3. Head and Neck Trachea-midline. Thyroid Gland Characteristics - normal size and consistency. Chest and Lung Exam Chest and lung exam reveals -on auscultation, normal breath sounds, no adventitious sounds and  normal vocal resonance. Breast Nipples-No Discharge. Breast Lump-No Palpable Breast Mass. Cardiovascular Cardiovascular examination reveals -normal heart sounds, regular rate and rhythm with no murmurs. Lymphatic Head &  Neck General Head & Neck Lymphatics: Bilateral - Description - Normal. Axillary General Axillary Region: Bilateral - Description - Normal. Note: no [place] adenopathy  Assessment & Plan Emelia Loron MD; 08/14/2017 12:25 PM) RADIAL SCAR OF BREAST (N64.89) Story: right breast seed guided excisional biopsy we discussed options including observation vs surgery. I think reasonable given appearance and csl to consider excision in this healthy woman. we discussed a 10% risk of upgrade to atypia or possibly dcis. we discussed radioactive seed guided excision of this lesion with risks/benefits/recovery. will proceed soon

## 2017-09-01 NOTE — Anesthesia Procedure Notes (Signed)
Procedure Name: LMA Insertion Date/Time: 09/01/2017 11:49 AM Performed by: Gar Gibbon Pre-anesthesia Checklist: Patient identified, Emergency Drugs available, Suction available and Patient being monitored Patient Re-evaluated:Patient Re-evaluated prior to induction Oxygen Delivery Method: Circle system utilized Preoxygenation: Pre-oxygenation with 100% oxygen Induction Type: IV induction Ventilation: Mask ventilation without difficulty LMA: LMA inserted LMA Size: 4.0 Number of attempts: 1 Airway Equipment and Method: Bite block Placement Confirmation: positive ETCO2 Tube secured with: Tape Dental Injury: Teeth and Oropharynx as per pre-operative assessment

## 2017-09-01 NOTE — Anesthesia Preprocedure Evaluation (Signed)
Anesthesia Evaluation  Patient identified by MRN, date of birth, ID band Patient awake    Reviewed: Allergy & Precautions, NPO status , Patient's Chart, lab work & pertinent test results  Airway Mallampati: II  TM Distance: >3 FB Neck ROM: Full    Dental no notable dental hx.    Pulmonary neg pulmonary ROS,    Pulmonary exam normal breath sounds clear to auscultation       Cardiovascular negative cardio ROS Normal cardiovascular exam Rhythm:Regular Rate:Normal     Neuro/Psych negative neurological ROS  negative psych ROS   GI/Hepatic negative GI ROS, Neg liver ROS,   Endo/Other  negative endocrine ROS  Renal/GU negative Renal ROS  negative genitourinary   Musculoskeletal negative musculoskeletal ROS (+)   Abdominal   Peds negative pediatric ROS (+)  Hematology negative hematology ROS (+)   Anesthesia Other Findings   Reproductive/Obstetrics negative OB ROS                             Anesthesia Physical Anesthesia Plan  ASA: I  Anesthesia Plan: General   Post-op Pain Management:    Induction: Intravenous  PONV Risk Score and Plan: 2 and Ondansetron and Dexamethasone  Airway Management Planned: LMA  Additional Equipment:   Intra-op Plan:   Post-operative Plan:   Informed Consent: I have reviewed the patients History and Physical, chart, labs and discussed the procedure including the risks, benefits and alternatives for the proposed anesthesia with the patient or authorized representative who has indicated his/her understanding and acceptance.   Dental advisory given  Plan Discussed with: CRNA and Surgeon  Anesthesia Plan Comments:         Anesthesia Quick Evaluation

## 2017-09-01 NOTE — Discharge Instructions (Signed)
[facility] °Office Phone Number [phone] ° °POST OP INSTRUCTIONS ° °Always review your discharge instruction sheet given to you by the facility where your surgery was performed. ° °IF YOU HAVE DISABILITY OR FAMILY LEAVE FORMS, YOU MUST BRING THEM TO THE OFFICE FOR PROCESSING.  DO NOT GIVE THEM TO YOUR DOCTOR. ° °1. A prescription for pain medication may be given to you upon discharge.  Take your pain medication as prescribed, if needed.  If narcotic pain medicine is not needed, then you may take acetaminophen (Tylenol), naprosyn (Alleve) or ibuprofen (Advil) as needed. °2. Take your usually prescribed medications unless otherwise directed °3. If you need a refill on your pain medication, please contact your pharmacy.  They will contact our office to request authorization.  Prescriptions will not be filled after 5pm or on week-ends. °4. You should eat very light the first 24 hours after surgery, such as soup, crackers, pudding, etc.  Resume your normal diet the day after surgery. °5. Most patients will experience some swelling and bruising in the breast.  Ice packs and a good support bra will help.  Wear the breast binder provided or a sports bra for 72 hours day and night.  After that wear a sports bra during the day until you return to the office. Swelling and bruising can take several days to resolve.  °6. It is common to experience some constipation if taking pain medication after surgery.  Increasing fluid intake and taking a stool softener will usually help or prevent this problem from occurring.  A mild laxative (Milk of Magnesia or Miralax) should be taken according to package directions if there are no bowel movements after 48 hours. °7. Unless discharge instructions indicate otherwise, you may remove your bandages 48 hours after surgery and you may shower at that time.  You may have steri-strips (small skin tapes) in place directly over the incision.  These strips should be left on the  skin for 7-10 days and will come off on their own.  If your surgeon used skin glue on the incision, you may shower in 24 hours.  The glue will flake off over the next 2-3 weeks.  Any sutures or staples will be removed at the office during your follow-up visit. °8. ACTIVITIES:  You may resume regular daily activities (gradually increasing) beginning the next day.  Wearing a good support bra or sports bra minimizes pain and swelling.  You may have sexual intercourse when it is comfortable. °a. You may drive when you no longer are taking prescription pain medication, you can comfortably wear a seatbelt, and you can safely maneuver your car and apply brakes. °b. RETURN TO WORK:  ______________________________________________________________________________________ °9. You should see your doctor in the office for a follow-up appointment approximately two weeks after your surgery.  Your doctor’s nurse will typically make your follow-up appointment when she calls you with your pathology report.  Expect your pathology report 3-4 business days after your surgery.  You may call to check if you do not hear from us after three days. °10. OTHER INSTRUCTIONS: _______________________________________________________________________________________________ _____________________________________________________________________________________________________________________________________ °_____________________________________________________________________________________________________________________________________ °_____________________________________________________________________________________________________________________________________ ° °WHEN TO CALL DR WAKEFIELD: °1. Fever over 101.0 °2. Nausea and/or vomiting. °3. Extreme swelling or bruising. °4. Continued bleeding from incision. °5. Increased pain, redness, or drainage from the incision. ° °The clinic staff is available to answer your questions during regular  business hours.  Please don’t hesitate to call and ask to speak to one of the nurses for clinical concerns.  If   you have a medical emergency, go to the nearest emergency room or call 911.  A surgeon from [facility] is always on call at the hospital. ° °For further questions, please visit [email or web address] ° ° ° ° ° °Post Anesthesia Home Care Instructions ° °Activity: °Get plenty of rest for the remainder of the day. A responsible individual must stay with you for 24 hours following the procedure.  °For the next 24 hours, DO NOT: °-Drive a car °-Operate machinery °-Drink alcoholic beverages °-Take any medication unless instructed by your physician °-Make any legal decisions or sign important papers. ° °Meals: °Start with liquid foods such as gelatin or soup. Progress to regular foods as tolerated. Avoid greasy, spicy, heavy foods. If nausea and/or vomiting occur, drink only clear liquids until the nausea and/or vomiting subsides. Call your physician if vomiting continues. ° °Special Instructions/Symptoms: °Your throat may feel dry or sore from the anesthesia or the breathing tube placed in your throat during surgery. If this causes discomfort, gargle with warm salt water. The discomfort should disappear within 24 hours. ° °If you had a scopolamine patch placed behind your ear for the management of post- operative nausea and/or vomiting: ° °1. The medication in the patch is effective for 72 hours, after which it should be removed.  Wrap patch in a tissue and discard in the trash. Wash hands thoroughly with soap and water. °2. You may remove the patch earlier than 72 hours if you experience unpleasant side effects which may include dry mouth, dizziness or visual disturbances. °3. Avoid touching the patch. Wash your hands with soap and water after contact with the patch. °  ° °

## 2017-09-01 NOTE — Anesthesia Postprocedure Evaluation (Signed)
Anesthesia Post Note  Patient: Andrea Adkins  Procedure(s) Performed: Procedure(s) (LRB): RADIOACTIVE SEED GUIDED EXCISIONAL BREAST BIOPSY (Right)     Patient location during evaluation: PACU Anesthesia Type: General Level of consciousness: awake and alert Pain management: pain level controlled Vital Signs Assessment: post-procedure vital signs reviewed and stable Respiratory status: spontaneous breathing, nonlabored ventilation, respiratory function stable and patient connected to nasal cannula oxygen Cardiovascular status: blood pressure returned to baseline and stable Postop Assessment: no apparent nausea or vomiting Anesthetic complications: no    Last Vitals:  Vitals:   09/01/17 1230 09/01/17 1231  BP: 103/84   Pulse: (!) 59 62  Resp: 14 12  Temp: 36.4 C   SpO2: 100% 100%    Last Pain:  Vitals:   09/01/17 1230  TempSrc:   PainSc: Asleep                 [name] S

## 2017-09-01 NOTE — Op Note (Signed)
Preoperative diagnoses: Right breast mammographic distortion with core biopsy c/w csl Postoperative diagnosis: Same as above Procedure: Rightbreast seed guided excisional biopsy Surgeon: Dr. Harden Mo Anesthesia: Gen. Estimated blood loss: minimal Complications: None Drains: None Specimens: Rightbreast tissue marked with paint Sponge and needle count correct at completion Disposition to recovery stable  Indications: This is a 62 yof with right breast distortion on screening mm. This underwent biopsy and is a radial scar. We discussed options and she desired excision. She had a radioactive seed placed prior to beginning that is about 7 mm away from the clip.  I had the mammograms in the OR.   Procedure: After informed consent was obtained she was then taken to the operating room. She was given ancef. Sequential compression devices were on her legs. She was placed under general anesthesia without complication. Her chestwas then prepped and draped in the standard sterile surgical fashion. A surgical timeout was then performed.  The seed was in the upper inner right breast. I infiltrated marcaine in the area and then made a periareolar incision to hide the scar  I then used the neoprobe to guide excision of the seedand surrounding tissue. I took some extra tissue to ensure I removed both the seed and clip. Mammogram confirmed removal of seed and the clip. This was then all sent to pathology. Hemostasis was observed. I closed the breast tissue with a 2-0 Vicryl. The dermis was closed with 3-0 Vicryl and the skin with 4-0 Monocryl.Dermabond and steristrips were placed on the incision. A breast binder was placed. She was transferred to recovery stable

## 2017-09-01 NOTE — Transfer of Care (Signed)
Immediate Anesthesia Transfer of Care Note  Patient: Andrea Adkins  Procedure(s) Performed: Procedure(s): RADIOACTIVE SEED GUIDED EXCISIONAL BREAST BIOPSY (Right)  Patient Location: PACU  Anesthesia Type:General  Level of Consciousness: sedated  Airway & Oxygen Therapy: Patient Spontanous Breathing and Patient connected to face mask oxygen  Post-op Assessment: Report given to RN and Post -op Vital signs reviewed and stable  Post vital signs: Reviewed and stable  Last Vitals:  Vitals:   09/01/17 1230 09/01/17 1231  BP:    Pulse: (!) 59 62  Resp: 14 12  Temp:    SpO2: 100% 100%    Last Pain:  Vitals:   09/01/17 1040  TempSrc: Oral  PainSc: 0-No pain         Complications: No apparent anesthesia complications

## 2017-09-01 NOTE — Interval H&P Note (Signed)
History and Physical Interval Note:  09/01/2017 11:28 AM  Andrea Adkins  has presented today for surgery, with the diagnosis of RIGHT BREAST MASS  The various methods of treatment have been discussed with the patient and family. After consideration of risks, benefits and other options for treatment, the patient has consented to  Procedure(s): RADIOACTIVE SEED GUIDED EXCISIONAL BREAST BIOPSY (Right) as a surgical intervention .  The patient's history has been reviewed, patient examined, no change in status, stable for surgery.  I have reviewed the patient's chart and labs.  Questions were answered to the patient's satisfaction.     Ismar Yabut

## 2017-09-02 ENCOUNTER — Encounter (HOSPITAL_BASED_OUTPATIENT_CLINIC_OR_DEPARTMENT_OTHER): Payer: Self-pay | Admitting: General Surgery

## 2018-05-12 ENCOUNTER — Ambulatory Visit: Payer: Self-pay

## 2018-05-12 ENCOUNTER — Other Ambulatory Visit: Payer: Self-pay | Admitting: Family Medicine

## 2018-05-12 DIAGNOSIS — M79644 Pain in right finger(s): Secondary | ICD-10-CM

## 2018-05-12 DIAGNOSIS — M25531 Pain in right wrist: Secondary | ICD-10-CM

## 2018-08-18 ENCOUNTER — Other Ambulatory Visit: Payer: Self-pay | Admitting: Obstetrics and Gynecology

## 2018-08-18 DIAGNOSIS — Z1231 Encounter for screening mammogram for malignant neoplasm of breast: Secondary | ICD-10-CM

## 2018-10-13 ENCOUNTER — Ambulatory Visit: Payer: Self-pay

## 2018-11-02 ENCOUNTER — Ambulatory Visit
Admission: RE | Admit: 2018-11-02 | Discharge: 2018-11-02 | Disposition: A | Discharge status: Home / Self Care | Payer: BC Managed Care – PPO | Source: Ambulatory Visit | Attending: Obstetrics and Gynecology | Admitting: Obstetrics and Gynecology

## 2018-11-02 DIAGNOSIS — Z1231 Encounter for screening mammogram for malignant neoplasm of breast: Secondary | ICD-10-CM

## 2019-08-09 ENCOUNTER — Other Ambulatory Visit: Payer: Self-pay | Admitting: Obstetrics and Gynecology

## 2019-08-09 DIAGNOSIS — Z1231 Encounter for screening mammogram for malignant neoplasm of breast: Secondary | ICD-10-CM

## 2019-11-08 ENCOUNTER — Ambulatory Visit
Admission: RE | Admit: 2019-11-08 | Discharge: 2019-11-08 | Disposition: A | Discharge status: Home / Self Care | Payer: BC Managed Care – PPO | Source: Ambulatory Visit | Attending: Obstetrics and Gynecology | Admitting: Obstetrics and Gynecology

## 2019-11-08 ENCOUNTER — Other Ambulatory Visit: Payer: Self-pay

## 2019-11-08 DIAGNOSIS — Z1231 Encounter for screening mammogram for malignant neoplasm of breast: Secondary | ICD-10-CM

## 2020-10-02 ENCOUNTER — Other Ambulatory Visit: Payer: Self-pay | Admitting: Obstetrics and Gynecology

## 2020-10-02 DIAGNOSIS — Z1231 Encounter for screening mammogram for malignant neoplasm of breast: Secondary | ICD-10-CM

## 2020-11-14 ENCOUNTER — Other Ambulatory Visit: Payer: Self-pay

## 2020-11-14 ENCOUNTER — Ambulatory Visit
Admission: RE | Admit: 2020-11-14 | Discharge: 2020-11-14 | Disposition: A | Discharge status: Home / Self Care | Payer: BC Managed Care – PPO | Source: Ambulatory Visit | Attending: Obstetrics and Gynecology | Admitting: Obstetrics and Gynecology

## 2020-11-14 DIAGNOSIS — Z1231 Encounter for screening mammogram for malignant neoplasm of breast: Secondary | ICD-10-CM

## 2021-04-05 ENCOUNTER — Other Ambulatory Visit: Payer: Self-pay | Admitting: Radiology

## 2021-04-05 DIAGNOSIS — N6320 Unspecified lump in the left breast, unspecified quadrant: Secondary | ICD-10-CM

## 2021-04-21 ENCOUNTER — Ambulatory Visit
Admission: RE | Admit: 2021-04-21 | Discharge: 2021-04-21 | Disposition: A | Discharge status: Home / Self Care | Payer: BC Managed Care – PPO | Source: Ambulatory Visit | Attending: Radiology | Admitting: Radiology

## 2021-04-21 ENCOUNTER — Other Ambulatory Visit: Payer: Self-pay | Admitting: Radiology

## 2021-04-21 ENCOUNTER — Other Ambulatory Visit: Payer: Self-pay

## 2021-04-21 DIAGNOSIS — N6320 Unspecified lump in the left breast, unspecified quadrant: Secondary | ICD-10-CM

## 2021-04-27 ENCOUNTER — Other Ambulatory Visit: Payer: Self-pay

## 2021-04-27 ENCOUNTER — Ambulatory Visit
Admission: RE | Admit: 2021-04-27 | Discharge: 2021-04-27 | Disposition: A | Discharge status: Home / Self Care | Payer: BC Managed Care – PPO | Source: Ambulatory Visit | Attending: Radiology | Admitting: Radiology

## 2021-04-27 DIAGNOSIS — N6320 Unspecified lump in the left breast, unspecified quadrant: Secondary | ICD-10-CM

## 2021-05-11 ENCOUNTER — Other Ambulatory Visit: Payer: BC Managed Care – PPO

## 2021-07-25 ENCOUNTER — Other Ambulatory Visit: Payer: Self-pay | Admitting: Radiology

## 2021-07-25 DIAGNOSIS — N6320 Unspecified lump in the left breast, unspecified quadrant: Secondary | ICD-10-CM

## 2021-08-09 ENCOUNTER — Other Ambulatory Visit: Payer: Self-pay

## 2021-08-09 ENCOUNTER — Ambulatory Visit: Payer: BC Managed Care – PPO

## 2021-08-09 ENCOUNTER — Ambulatory Visit
Admission: RE | Admit: 2021-08-09 | Discharge: 2021-08-09 | Disposition: A | Discharge status: Home / Self Care | Payer: BC Managed Care – PPO | Source: Ambulatory Visit | Attending: Radiology | Admitting: Radiology

## 2021-08-09 DIAGNOSIS — N6320 Unspecified lump in the left breast, unspecified quadrant: Secondary | ICD-10-CM

## 2021-08-10 ENCOUNTER — Other Ambulatory Visit: Payer: Self-pay | Admitting: Radiology

## 2021-08-10 DIAGNOSIS — N6310 Unspecified lump in the right breast, unspecified quadrant: Secondary | ICD-10-CM

## 2021-09-04 ENCOUNTER — Ambulatory Visit
Admission: RE | Admit: 2021-09-04 | Discharge: 2021-09-04 | Disposition: A | Discharge status: Home / Self Care | Payer: BC Managed Care – PPO | Source: Ambulatory Visit | Attending: Radiology | Admitting: Radiology

## 2021-09-04 ENCOUNTER — Other Ambulatory Visit: Payer: Self-pay

## 2021-09-04 ENCOUNTER — Other Ambulatory Visit: Payer: Self-pay | Admitting: Radiology

## 2021-09-04 DIAGNOSIS — N6320 Unspecified lump in the left breast, unspecified quadrant: Secondary | ICD-10-CM

## 2021-09-04 DIAGNOSIS — N6310 Unspecified lump in the right breast, unspecified quadrant: Secondary | ICD-10-CM

## 2021-11-05 ENCOUNTER — Other Ambulatory Visit: Payer: Self-pay | Admitting: Radiology

## 2021-11-05 ENCOUNTER — Ambulatory Visit
Admission: RE | Admit: 2021-11-05 | Discharge: 2021-11-05 | Disposition: A | Discharge status: Home / Self Care | Payer: BC Managed Care – PPO | Source: Ambulatory Visit | Attending: Radiology | Admitting: Radiology

## 2021-11-05 ENCOUNTER — Ambulatory Visit: Payer: BC Managed Care – PPO

## 2021-11-05 DIAGNOSIS — N6310 Unspecified lump in the right breast, unspecified quadrant: Secondary | ICD-10-CM

## 2022-06-04 ENCOUNTER — Other Ambulatory Visit: Payer: Self-pay | Admitting: Obstetrics and Gynecology

## 2022-06-04 DIAGNOSIS — N6310 Unspecified lump in the right breast, unspecified quadrant: Secondary | ICD-10-CM

## 2022-06-07 ENCOUNTER — Ambulatory Visit
Admission: RE | Admit: 2022-06-07 | Discharge: 2022-06-07 | Disposition: A | Discharge status: Home / Self Care | Payer: BC Managed Care – PPO | Source: Ambulatory Visit | Attending: Radiology | Admitting: Radiology

## 2022-06-07 DIAGNOSIS — N6310 Unspecified lump in the right breast, unspecified quadrant: Secondary | ICD-10-CM

## 2022-07-15 IMAGING — MG MM DIGITAL DIAGNOSTIC UNILAT*L* W/ TOMO W/ CAD
4 series · 4 of 12 positions shown · non-contrast
Comparison: Previous exam(s).

CLINICAL DATA: The patient has known fibrocystic changes and has
had multiple bilateral aspirations. The patient had a stereotactic
biopsy in 9799 for subtle distortion. Pathology demonstrated usual
ductal hyperplasia, Pash, and foci of foamy histiocytes suggesting
previous biopsy and or previous surgical site. The patient has not
had previous biopsy or surgery on the left but has had multiple
aspirations. The pathology was considered concordant and a six-month
follow-up was recommended. I have reviewed the biopsy and the area
of subtle distortion appears to been biopsied appropriately.

EXAM:
DIGITAL DIAGNOSTIC UNILATERAL LEFT MAMMOGRAM WITH TOMOSYNTHESIS AND
CAD
TECHNIQUE: Left digital diagnostic mammography and breast tomosynthesis was
performed. The images were evaluated with computer-aided detection.

[L CC synth-2D]
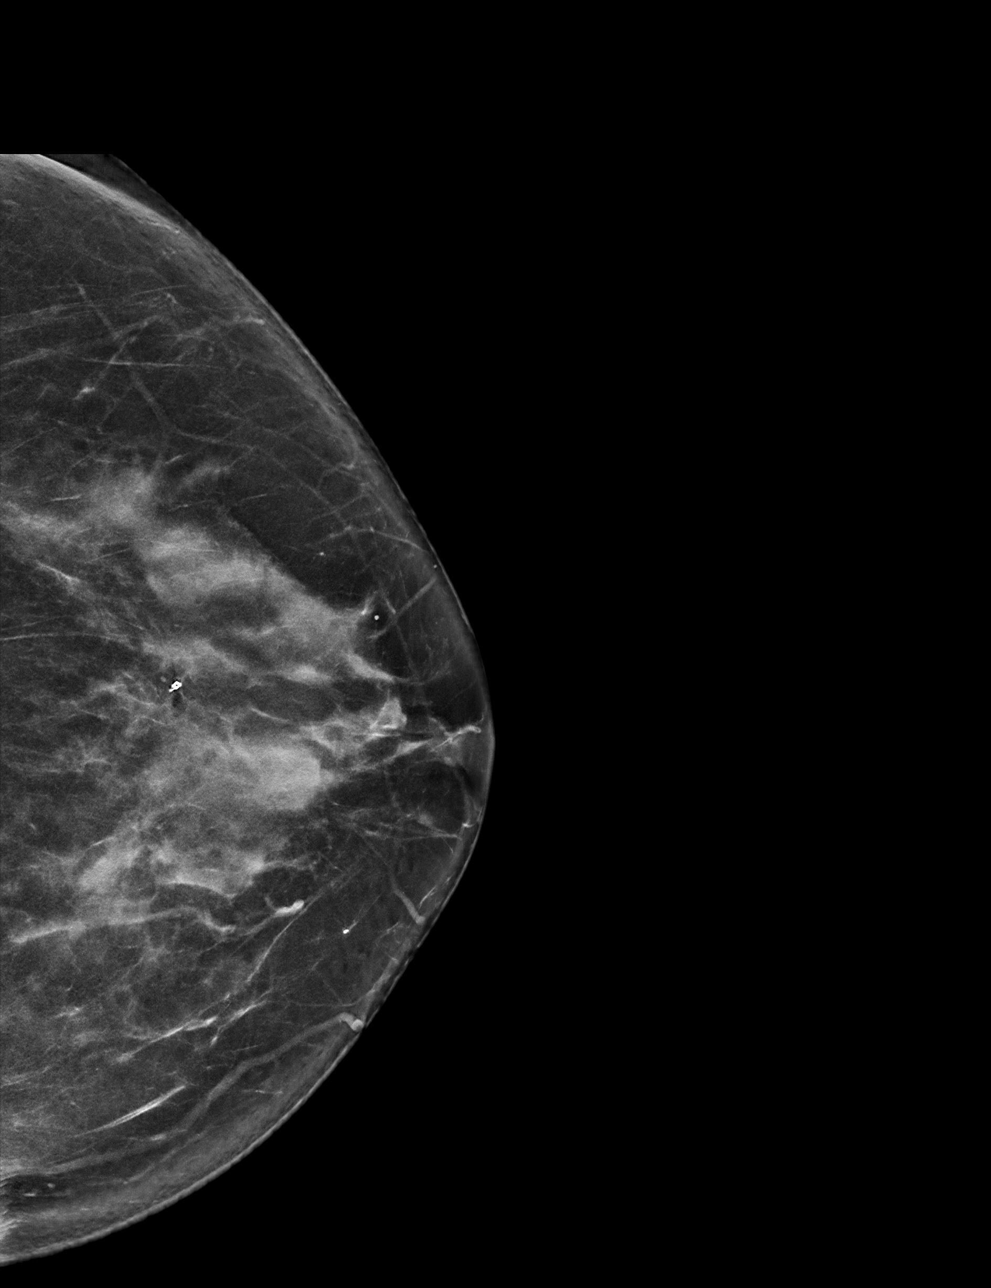

[L MLO synth-2D]
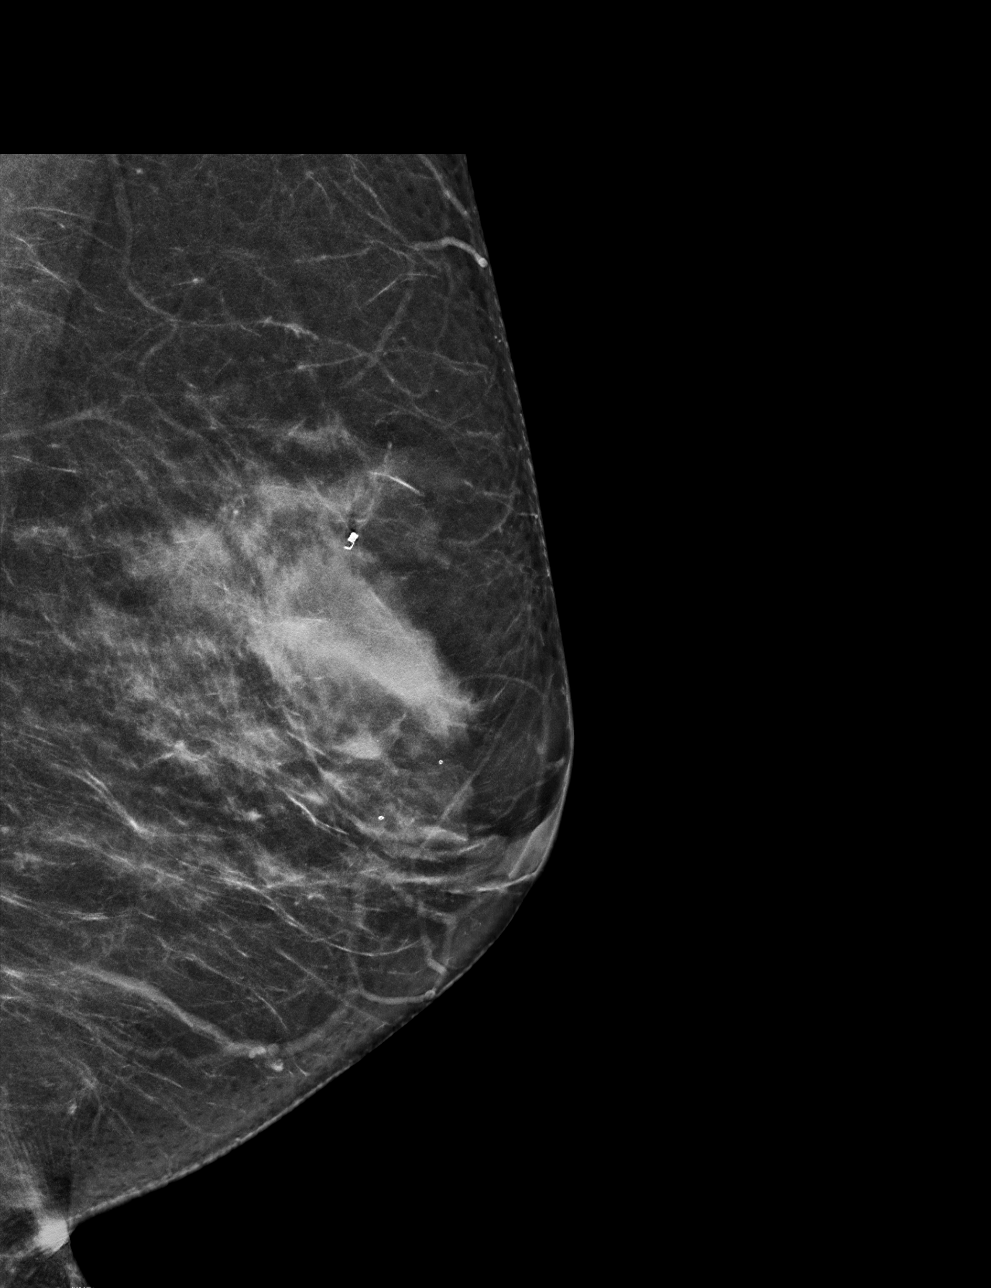

[L MLO tomo · tomo slice 32/63.0]
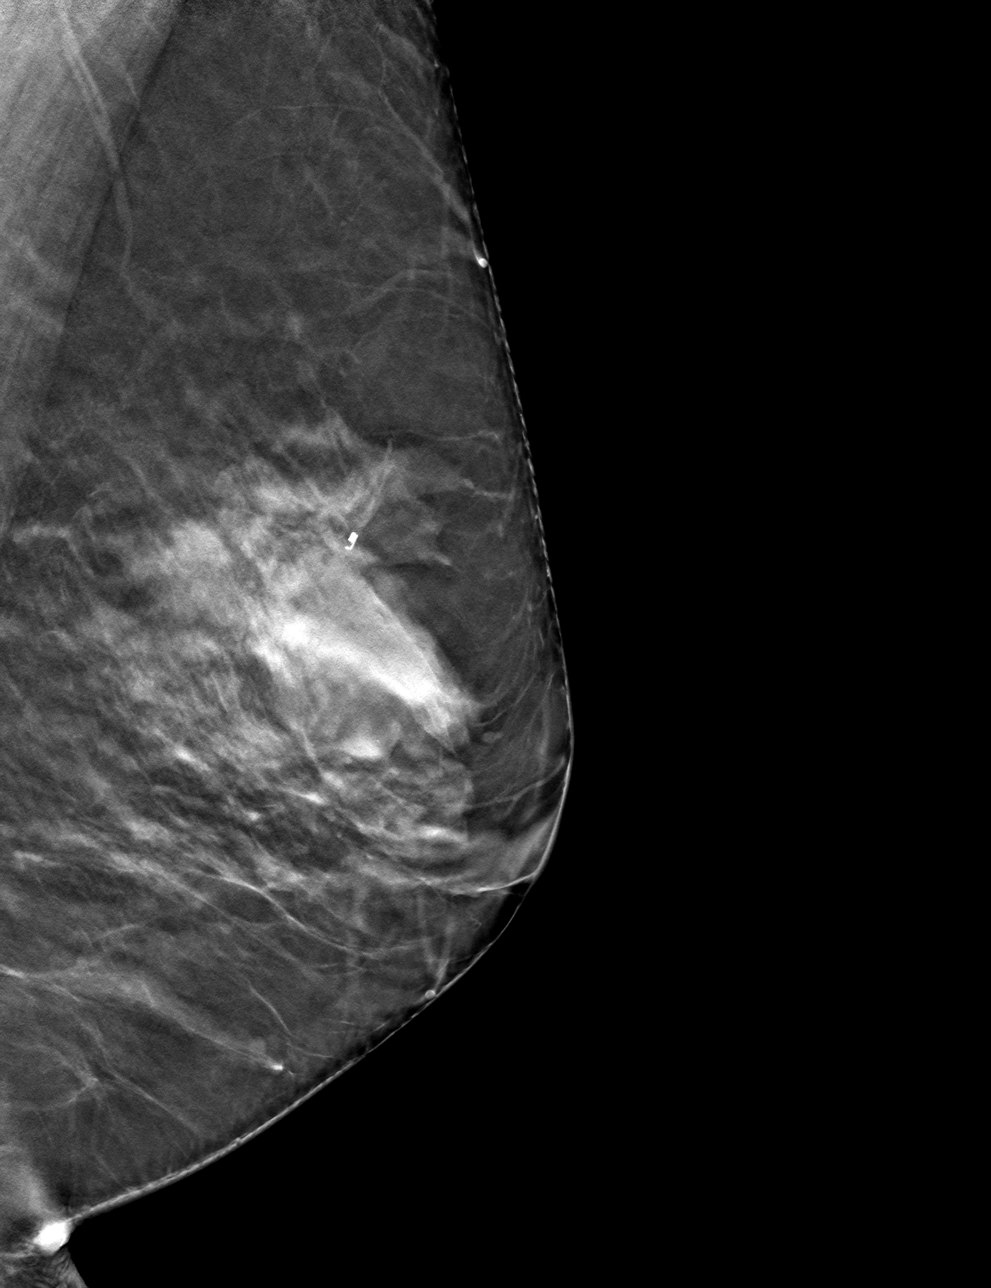

[L CC tomo · tomo slice 34/67.0]
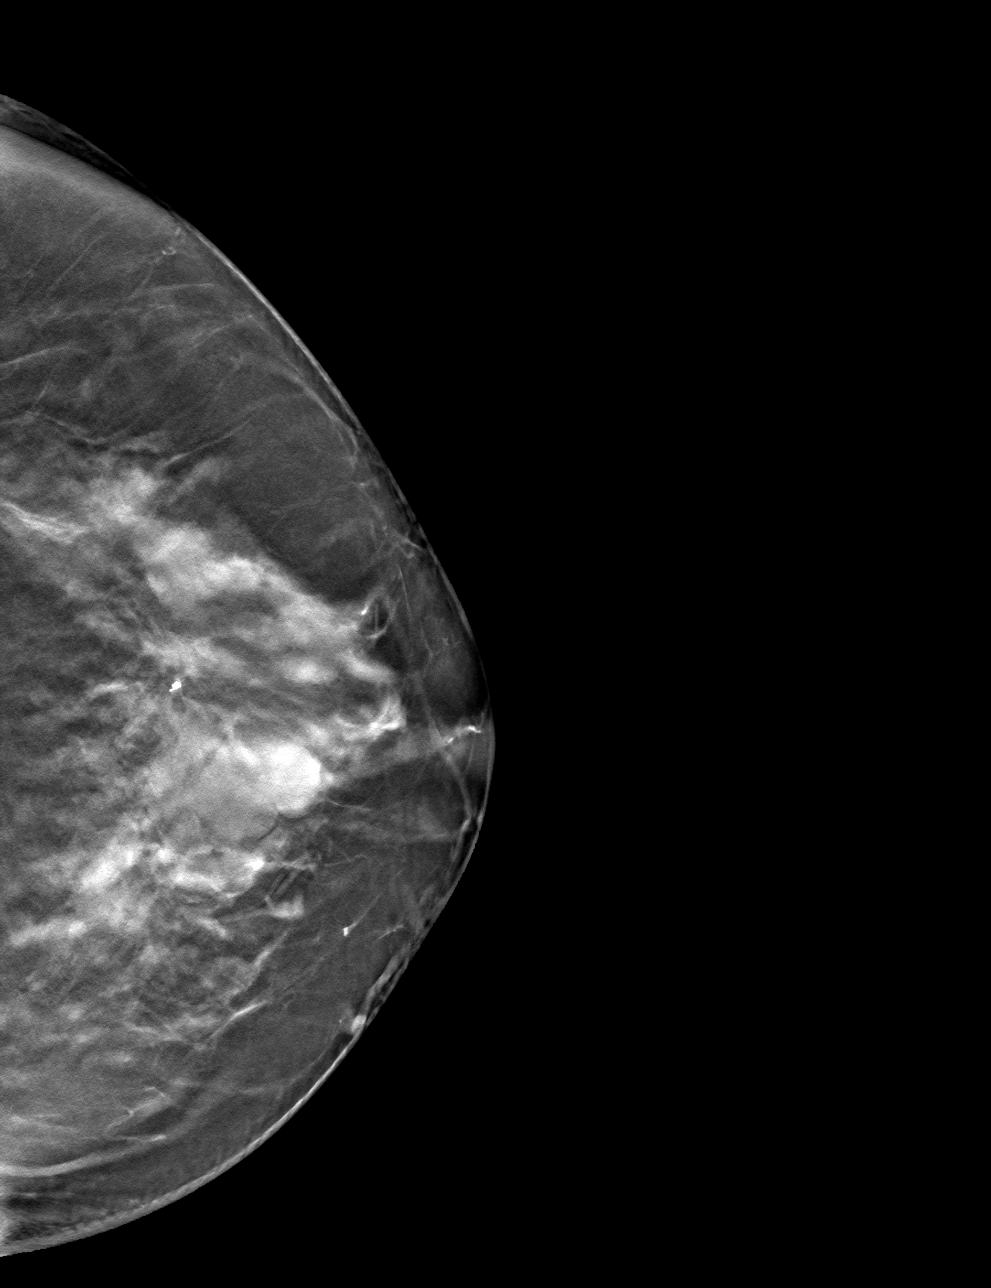

[4 of 12 positions shown; findings below may reference images not displayed]

ACR Breast Density Category c: The breast tissue is heterogeneously
dense, which may obscure small masses.
FINDINGS: The patient has a waxing and waning cystic pattern. The subtle
distortion is just lateral to the biopsy clip. No other suspicious
findings in the left breast.
IMPRESSION: The subtle distortion remains, just lateral to the biopsy clip.
Other suspicious findings.

RECOMMENDATION:
Recommend six-month follow-up mammogram of the left breast to ensure
further stability.

I have discussed the findings and recommendations with the patient.
If applicable, a reminder letter will be sent to the patient
regarding the next appointment.

BI-RADS CATEGORY  3: Probably benign.

## 2022-08-30 ENCOUNTER — Ambulatory Visit (INDEPENDENT_AMBULATORY_CARE_PROVIDER_SITE_OTHER): Payer: BC Managed Care – PPO

## 2022-08-30 ENCOUNTER — Ambulatory Visit: Payer: BC Managed Care – PPO | Admitting: Orthopedic Surgery

## 2022-08-30 DIAGNOSIS — M79604 Pain in right leg: Secondary | ICD-10-CM

## 2022-08-31 ENCOUNTER — Encounter: Payer: Self-pay | Admitting: Orthopedic Surgery

## 2022-08-31 NOTE — Progress Notes (Signed)
Office Visit Note   Patient: Andrea Adkins           Date of Birth: Jun 30, 1956           MRN: [id number] Visit Date: 08/30/2022 Requested by: Marcelle Overlie, MD [address] [place],  [place] [address] PCP: Marcelle Overlie, MD  Subjective: Chief Complaint  Patient presents with   Right Leg - Pain    HPI: Andrea Adkins is a 66 year old patient with right hip and leg pain.  She has had chronic pain but has been worse for the last 2 to 3 months.  Denies any history of injury.  Denies any groin pain.  Reports low back pain but no numbness and tingling.  Does describe radiating right leg pain which goes into the buttock and into the right leg.  Most of her pain is in the sciatic notch region with less in the low back region.  When she goes from sitting to standing the pain radiates down to the ankle.  Does not limit her walking endurance.  She likes to work a lot in the garden which requires bending.  No prior hip or back surgery.  Also localizing pain to the greater trochanteric region.  Uses Tiger balm with relief.  Motrin taken occasionally helps her symptoms as well.             ROS: All systems reviewed are negative as they relate to the chief complaint within the history of present illness.  Patient denies  fevers or chills.   Assessment & Plan: Visit Diagnoses:  1. Pain in right leg     Plan: Impression is right hip and low back pain.  I think there may be a component of trochanteric bursitis to this.  She has some mild scoliosis and facet arthritis in the lower lumbar spine region; however, she does have tenderness directly around the trochanteric region.  Talked about trochanteric injection today.  Showed her how to do some iliotibial band stretching exercises.  Hip itself is normal on exam and also normal on the visualized hip joint from the AP pelvis.  Follow-Up Instructions: No follow-ups on file.   Orders:  Orders Placed This Encounter  Procedures   XR Lumbar  Spine 2-3 Views   No orders of the defined types were placed in this encounter.     Procedures: No procedures performed   Clinical Data: No additional findings.  Objective: Vital Signs: There were no vitals taken for this visit.  Physical Exam:   Constitutional: Patient appears well-developed HEENT:  Head: Normocephalic Eyes:EOM are normal Neck: Normal range of motion Cardiovascular: Normal rate Pulmonary/chest: Effort normal Neurologic: Patient is alert Skin: Skin is warm Psychiatric: Patient has normal mood and affect   Ortho Exam: Patient has normal gait and alignment.  5 out of 5 ankle dorsiflexion plantarflexion quad and hamstring strength.  No nerve root tension signs.  Does have tenderness to the trochanteric region on the right compared to the left.  No paresthesias L1 S1 bilaterally.  Pedal pulses palpable.  No muscle atrophy in either leg.  No rashes or skin changes in the back region  Specialty Comments:  No specialty comments available.  Imaging: XR Lumbar Spine 2-3 Views  Result Date: 08/31/2022 AP lateral radiographs lumbar spine reviewed.  Mild thoracolumbar scoliosis is present.  Facet arthritis noted at L5-S1.  No spondylolisthesis and no acute compression fractures.    PMFS History: There are no problems to display for this patient.  Past Medical History:  Diagnosis Date   Heart murmur    no meds   Mitral valve prolapse     No family history on file.  Past Surgical History:  Procedure Laterality Date   arm surgery     BREAST CYST ASPIRATION Right    BREAST EXCISIONAL BIOPSY Right    benign   ECTOPIC PREGNANCY SURGERY     RADIOACTIVE SEED GUIDED EXCISIONAL BREAST BIOPSY Right 09/01/2017   Procedure: RADIOACTIVE SEED GUIDED EXCISIONAL BREAST BIOPSY;  Surgeon: Rolm Bookbinder, MD;  Location: [facility];  Service: General;  Laterality: Right;   TONSILLECTOMY     Social History   Occupational History   Not on file   Tobacco Use   Smoking status: Never   Smokeless tobacco: Never  Vaping Use   Vaping Use: Never used  Substance and Sexual Activity   Alcohol use: No   Drug use: No   Sexual activity: Not on file

## 2022-09-23 ENCOUNTER — Other Ambulatory Visit: Payer: Self-pay | Admitting: Obstetrics and Gynecology

## 2022-09-23 DIAGNOSIS — Z1231 Encounter for screening mammogram for malignant neoplasm of breast: Secondary | ICD-10-CM

## 2022-11-08 ENCOUNTER — Ambulatory Visit
Admission: RE | Admit: 2022-11-08 | Discharge: 2022-11-08 | Disposition: A | Discharge status: Home / Self Care | Payer: BC Managed Care – PPO | Source: Ambulatory Visit | Attending: Obstetrics and Gynecology | Admitting: Obstetrics and Gynecology

## 2022-11-08 DIAGNOSIS — Z1231 Encounter for screening mammogram for malignant neoplasm of breast: Secondary | ICD-10-CM

## 2023-04-17 ENCOUNTER — Other Ambulatory Visit: Payer: Self-pay | Admitting: Family

## 2023-04-17 DIAGNOSIS — N6320 Unspecified lump in the left breast, unspecified quadrant: Secondary | ICD-10-CM

## 2023-04-30 ENCOUNTER — Ambulatory Visit
Admission: RE | Admit: 2023-04-30 | Discharge: 2023-04-30 | Disposition: A | Discharge status: Home / Self Care | Payer: BC Managed Care – PPO | Source: Ambulatory Visit | Attending: Family | Admitting: Family

## 2023-04-30 DIAGNOSIS — N6320 Unspecified lump in the left breast, unspecified quadrant: Secondary | ICD-10-CM

## 2023-12-26 ENCOUNTER — Other Ambulatory Visit: Payer: Self-pay | Admitting: Obstetrics and Gynecology

## 2023-12-26 DIAGNOSIS — Z1231 Encounter for screening mammogram for malignant neoplasm of breast: Secondary | ICD-10-CM

## 2024-01-09 ENCOUNTER — Ambulatory Visit
Admission: RE | Admit: 2024-01-09 | Discharge: 2024-01-09 | Disposition: A | Discharge status: Home / Self Care | Payer: Medicare PPO | Source: Ambulatory Visit | Attending: Obstetrics and Gynecology | Admitting: Obstetrics and Gynecology

## 2024-01-09 DIAGNOSIS — Z1231 Encounter for screening mammogram for malignant neoplasm of breast: Secondary | ICD-10-CM

## 2024-01-25 LAB — COLOGUARD: COLOGUARD: NEGATIVE

## 2024-12-15 ENCOUNTER — Other Ambulatory Visit (HOSPITAL_COMMUNITY): Payer: Self-pay | Admitting: Obstetrics and Gynecology

## 2024-12-15 DIAGNOSIS — Z1231 Encounter for screening mammogram for malignant neoplasm of breast: Secondary | ICD-10-CM

## 2025-01-12 ENCOUNTER — Encounter (HOSPITAL_COMMUNITY): Payer: Self-pay

## 2025-01-12 ENCOUNTER — Ambulatory Visit (HOSPITAL_COMMUNITY)
Admission: RE | Admit: 2025-01-12 | Discharge: 2025-01-12 | Disposition: A | Source: Ambulatory Visit | Attending: Obstetrics and Gynecology | Admitting: Obstetrics and Gynecology

## 2025-01-12 DIAGNOSIS — Z1231 Encounter for screening mammogram for malignant neoplasm of breast: Secondary | ICD-10-CM
